# Patient Record
Sex: Female | Born: 2000 | Race: Black or African American | Hispanic: No | Marital: Single | Smoking: Never smoker
Health system: Southern US, Community
[De-identification: ages and names within clinical notes are randomized; demographics above are authoritative.]

## PROBLEM LIST (undated history)

## (undated) DIAGNOSIS — F909 Attention-deficit hyperactivity disorder, unspecified type: Secondary | ICD-10-CM

## (undated) DIAGNOSIS — F329 Major depressive disorder, single episode, unspecified: Secondary | ICD-10-CM

## (undated) DIAGNOSIS — F32A Depression, unspecified: Secondary | ICD-10-CM

## (undated) DIAGNOSIS — J45909 Unspecified asthma, uncomplicated: Secondary | ICD-10-CM

## (undated) HISTORY — DX: Attention-deficit hyperactivity disorder, unspecified type: F90.9

## (undated) HISTORY — DX: Depression, unspecified: F32.A

## (undated) HISTORY — DX: Major depressive disorder, single episode, unspecified: F32.9

---
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
## 2018-01-11 ENCOUNTER — Emergency Department (HOSPITAL_COMMUNITY): Admission: EM | Admit: 2018-01-11 | Payer: Managed Care, Other (non HMO) | Source: Home / Self Care

## 2018-01-11 NOTE — ED Notes (Signed)
Remains outside on the phone

## 2018-01-11 NOTE — ED Notes (Signed)
Pt inciting a delay for triage. Pt is intoxicated, arguing with her friends who brought her here. Does not want to be seen currently. Walked away from registration without getting her arm band placed. Pt still outside on phone deciding if she wants to be seen.

## 2018-01-11 NOTE — ED Notes (Signed)
Pt remains outside on phone. Unable to place arm band or obtain EKG.

## 2018-02-24 IMAGING — CR DG CHEST 2V
2 series · 2 of 2 positions shown · non-contrast
Comparison: None

CLINICAL DATA: Shortness of breath and nonproductive cough since
yesterday, central chest pain, pain with taking deep breaths,
history asthma

EXAM:
CHEST  2 VIEW

[chest pa]
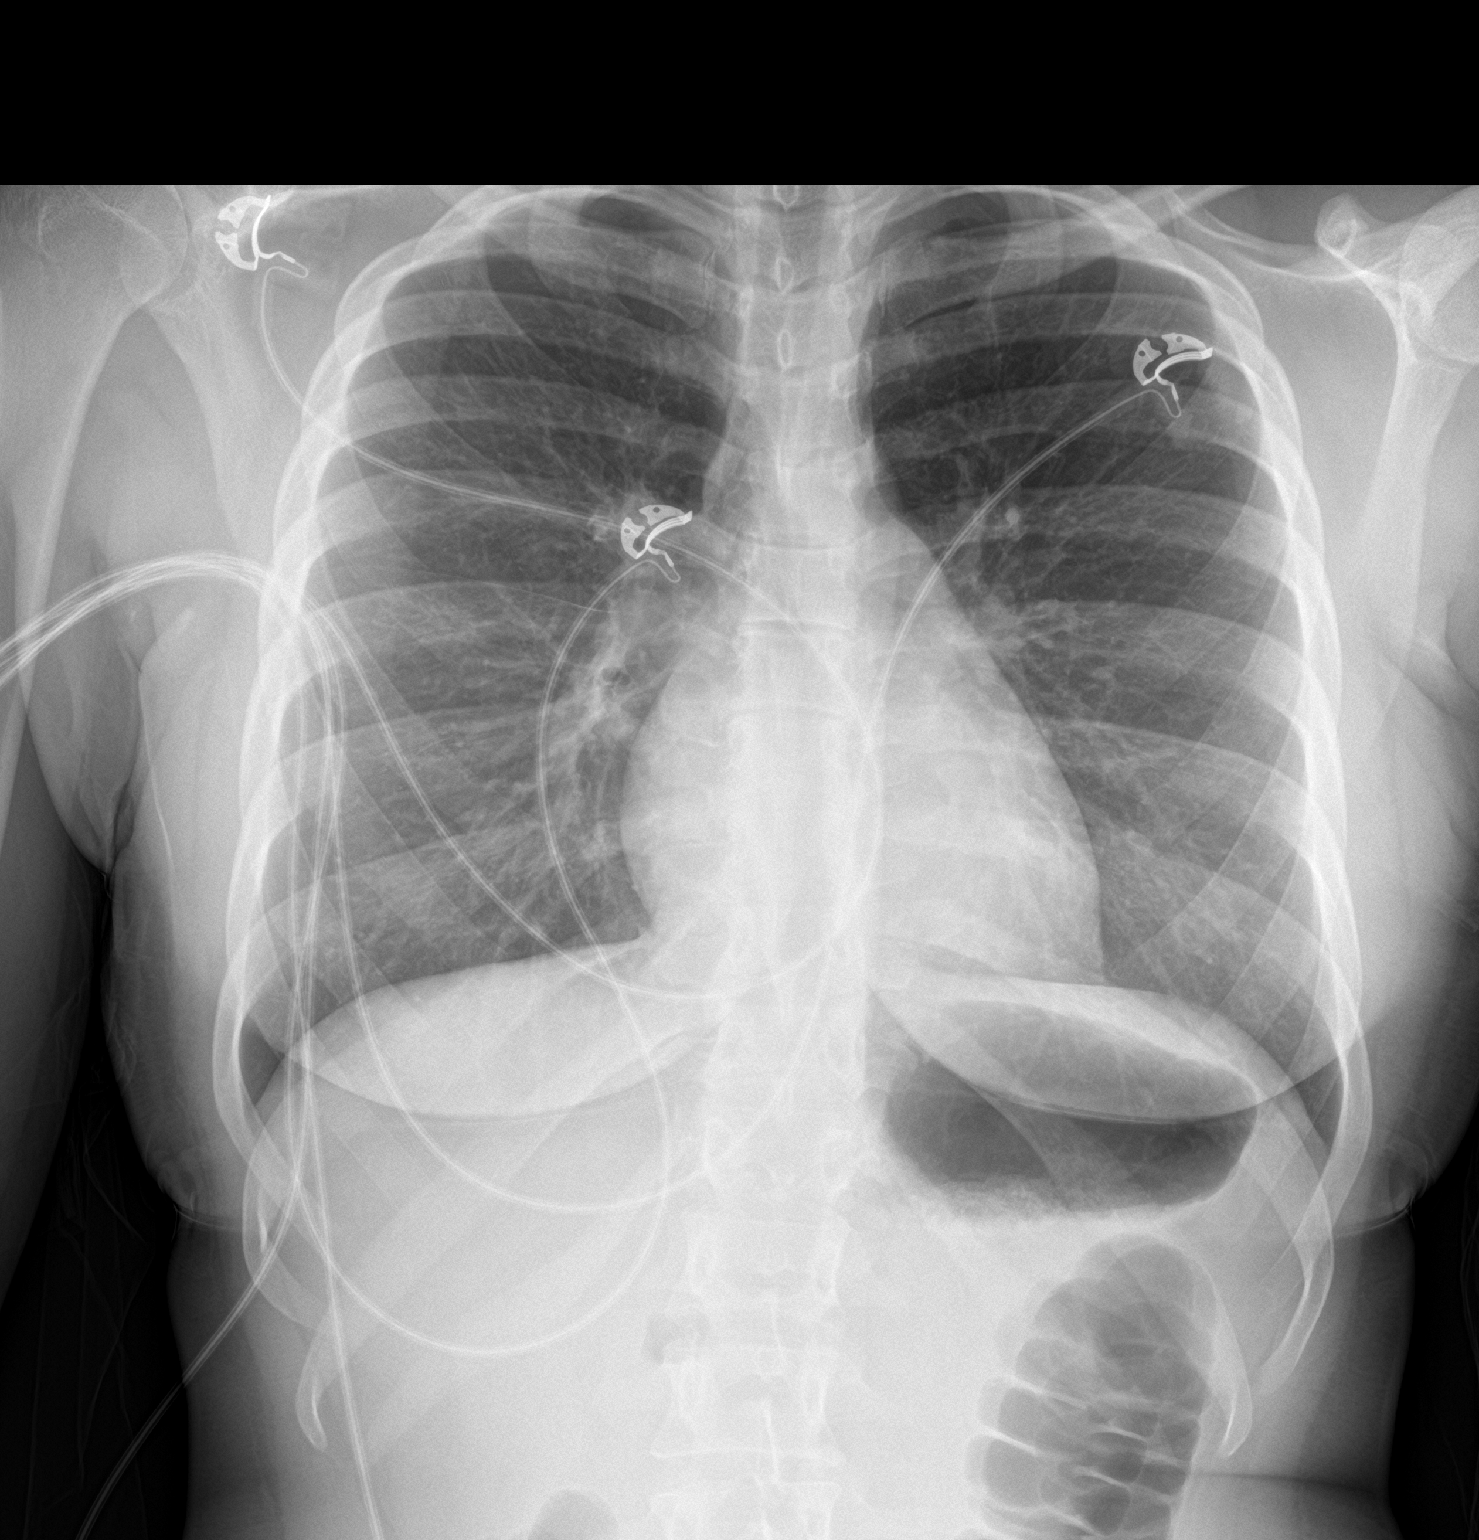

[chest lat]
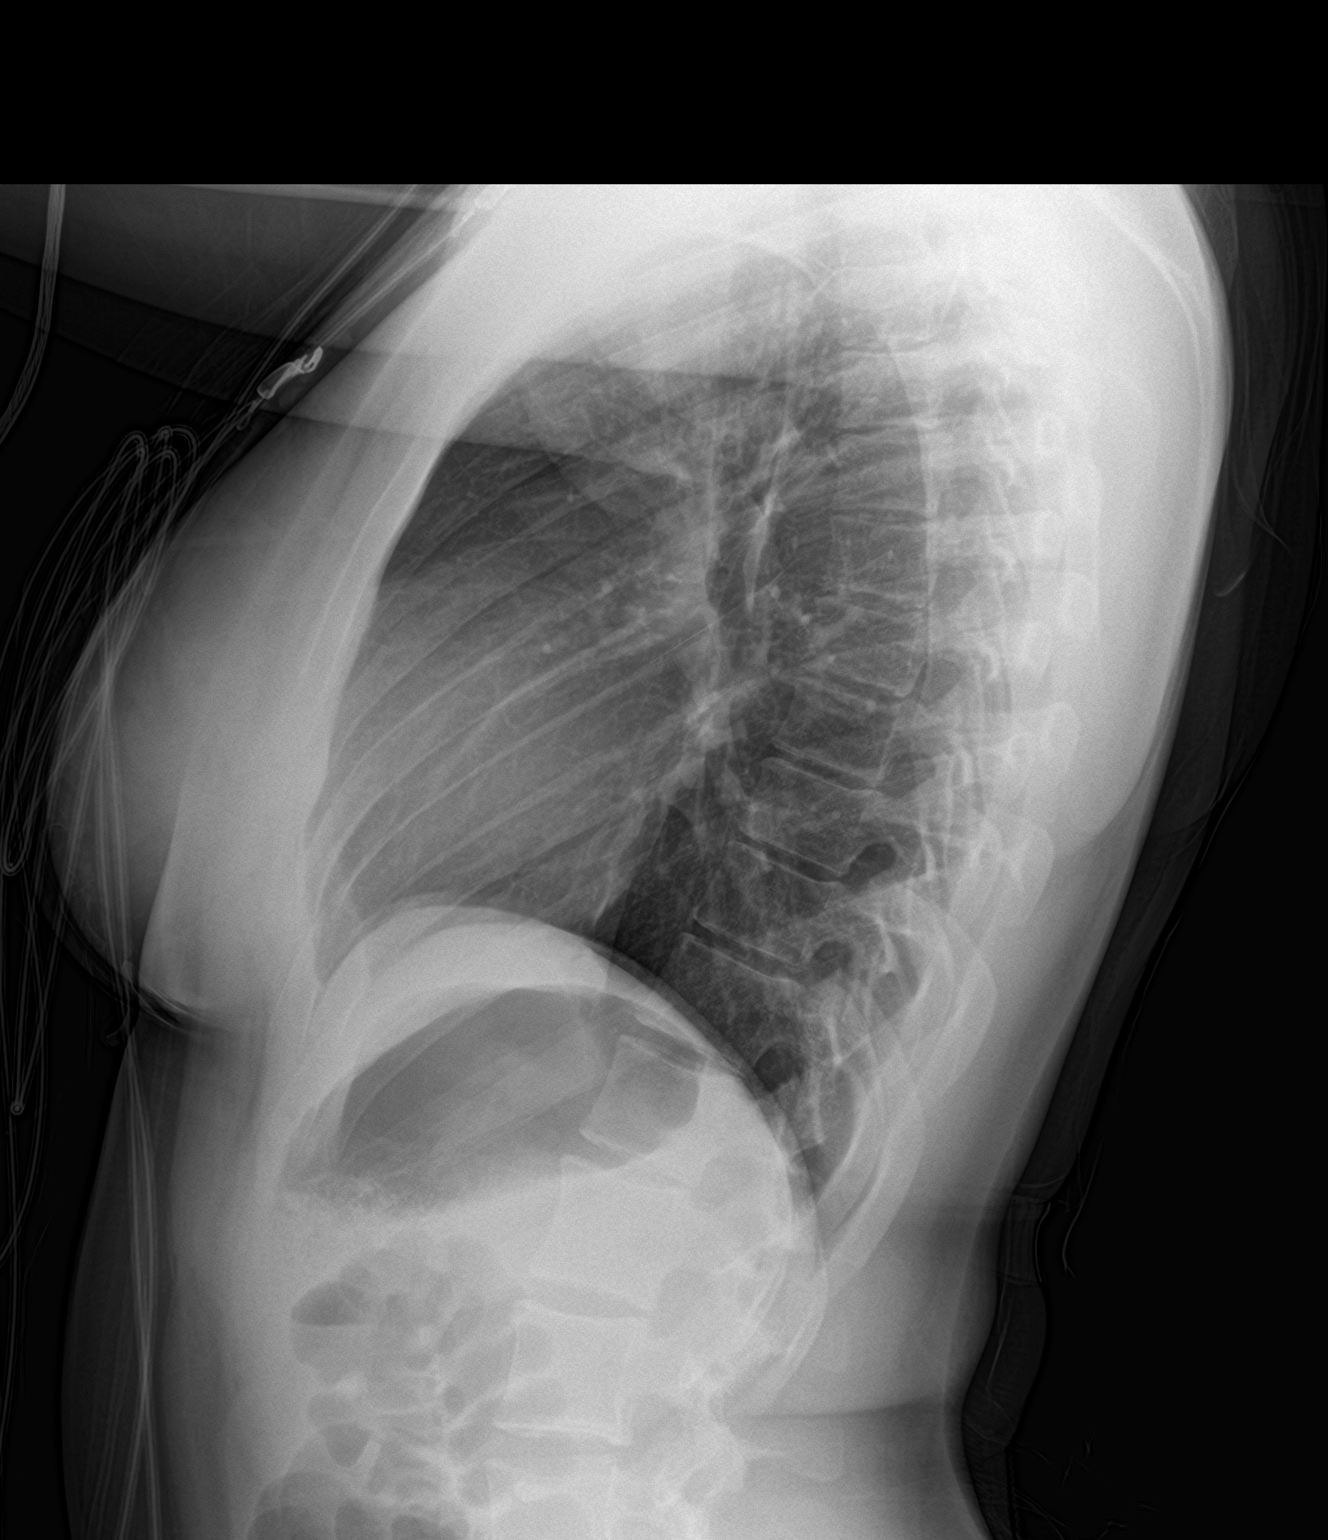

[2 of 2 positions shown; findings below may reference images not displayed]

FINDINGS: Normal heart size, mediastinal contours, and pulmonary vascularity.

Lungs clear.

No pleural effusion or pneumothorax.

Bones unremarkable.
IMPRESSION: Normal exam.

## 2019-09-22 ENCOUNTER — Encounter: Payer: Managed Care, Other (non HMO) | Admitting: Family Medicine

## 2019-10-14 ENCOUNTER — Ambulatory Visit (INDEPENDENT_AMBULATORY_CARE_PROVIDER_SITE_OTHER): Payer: Managed Care, Other (non HMO) | Admitting: Family Medicine

## 2019-10-14 ENCOUNTER — Other Ambulatory Visit: Payer: Self-pay

## 2019-10-14 ENCOUNTER — Encounter: Payer: Self-pay | Admitting: Family Medicine

## 2019-10-14 VITALS — BP 98/76 | HR 82 | Temp 97.9°F | Wt 90.0 lb

## 2019-10-14 DIAGNOSIS — F419 Anxiety disorder, unspecified: Secondary | ICD-10-CM | POA: Diagnosis not present

## 2019-10-14 DIAGNOSIS — Z0000 Encounter for general adult medical examination without abnormal findings: Secondary | ICD-10-CM | POA: Diagnosis not present

## 2019-10-14 DIAGNOSIS — R634 Abnormal weight loss: Secondary | ICD-10-CM | POA: Diagnosis not present

## 2019-10-14 DIAGNOSIS — Z113 Encounter for screening for infections with a predominantly sexual mode of transmission: Secondary | ICD-10-CM | POA: Diagnosis not present

## 2019-10-14 DIAGNOSIS — N6310 Unspecified lump in the right breast, unspecified quadrant: Secondary | ICD-10-CM

## 2019-10-14 LAB — LIPID PANEL
Cholesterol: 123 mg/dL (ref 0–200)
HDL: 67.8 mg/dL (ref >39.00)
LDL Cholesterol: 47 mg/dL (ref 0–99)
NonHDL: 54.91
Total CHOL/HDL Ratio: 2
Triglycerides: 38 mg/dL (ref 0.0–149.0)
VLDL: 7.6 mg/dL (ref 0.0–40.0)

## 2019-10-14 LAB — COMPREHENSIVE METABOLIC PANEL
ALT: 10 U/L (ref 0–35)
AST: 13 U/L (ref 0–37)
Albumin: 4.4 g/dL (ref 3.5–5.2)
Alkaline Phosphatase: 32 U/L — ABNORMAL LOW (ref 39–117)
BUN: 14 mg/dL (ref 6–23)
CO2: 28 mEq/L (ref 19–32)
Calcium: 9.3 mg/dL (ref 8.4–10.5)
Chloride: 106 mEq/L (ref 96–112)
Creatinine, Ser: 0.69 mg/dL (ref 0.40–1.20)
GFR: 130.39 mL/min (ref >60.00)
Glucose, Bld: 82 mg/dL (ref 70–99)
Potassium: 3.7 mEq/L (ref 3.5–5.1)
Sodium: 140 mEq/L (ref 135–145)
Total Bilirubin: 0.6 mg/dL (ref 0.2–1.2)
Total Protein: 6.9 g/dL (ref 6.0–8.3)

## 2019-10-14 LAB — CBC
HCT: 37.1 % (ref 36.0–46.0)
Hemoglobin: 12.3 g/dL (ref 12.0–15.0)
MCHC: 33.1 g/dL (ref 30.0–36.0)
MCV: 94.5 fl (ref 78.0–100.0)
Platelets: 195 10*3/uL (ref 150.0–400.0)
RBC: 3.93 Mil/uL (ref 3.87–5.11)
RDW: 13.1 % (ref 11.5–14.6)
WBC: 2.7 10*3/uL — ABNORMAL LOW (ref 4.5–10.5)

## 2019-10-14 LAB — TSH: TSH: 4.82 u[IU]/mL (ref 0.35–5.50)

## 2019-10-14 LAB — T4, FREE: Free T4: 0.99 ng/dL (ref 0.60–1.60)

## 2019-10-14 NOTE — Progress Notes (Signed)
Subjective:     Kim Stark is a 19 y.o. female and is here for a comprehensive physical exam. The patient reports problems - anxiety, weight loss.  Pt notes increasing anxiety.  Starting to affect her life.  Pt's mom and current partner have noticed.  Having anxiety about eating food as it may cause weight gain.  Currently eating one meal and snacks during the day.  Pt endorses wt loss over the last 2 yrs.  Denies inducing emesis, binge eating, abd pain, acid reflux.   Pt with h/o R breast cyst.  Cyst still present, unchanged.  Pt notes intermittent tenderness/pain.  Denies nipple d/c, nipple retraction, skin changes, erythema.  LMP last wk, ended on Tuesday.  Social History   Socioeconomic History  . Marital status: Single    Spouse name: Not on file  . Number of children: Not on file  . Years of education: Not on file  . Highest education level: Not on file  Occupational History  . Not on file  Tobacco Use  . Smoking status: Never Smoker  . Smokeless tobacco: Never Used  Substance and Sexual Activity  . Alcohol use: No  . Drug use: No  . Sexual activity: Not on file  Other Topics Concern  . Not on file  Social History Narrative  . Not on file   Social Determinants of Health   Financial Resource Strain:   . Difficulty of Paying Living Expenses: Not on file  Food Insecurity:   . Worried About Programme researcher, broadcasting/film/video in the Last Year: Not on file  . Ran Out of Food in the Last Year: Not on file  Transportation Needs:   . Lack of Transportation (Medical): Not on file  . Lack of Transportation (Non-Medical): Not on file  Physical Activity:   . Days of Exercise per Week: Not on file  . Minutes of Exercise per Session: Not on file  Stress:   . Feeling of Stress : Not on file  Social Connections:   . Frequency of Communication with Friends and Family: Not on file  . Frequency of Social Gatherings with Friends and Family: Not on file  . Attends Religious Services: Not on  file  . Active Member of Clubs or Organizations: Not on file  . Attends Banker Meetings: Not on file  . Marital Status: Not on file  Intimate Partner Violence:   . Fear of Current or Ex-Partner: Not on file  . Emotionally Abused: Not on file  . Physically Abused: Not on file  . Sexually Abused: Not on file   Health Maintenance  Topic Date Due  . HIV Screening  02/15/2014  . TETANUS/TDAP  02/15/2018  . INFLUENZA VACCINE  04/17/2019    The following portions of the patient's history were reviewed and updated as appropriate: allergies, current medications, past family history, past medical history, past social history, past surgical history and problem list.  Review of Systems Pertinent items noted in HPI and remainder of comprehensive ROS otherwise negative.   Objective:    BP 98/76 (BP Location: Left Arm, Patient Position: Sitting, Cuff Size: Normal)   Pulse 82   Temp 97.9 F (36.6 C) (Temporal)   Wt 90 lb (40.8 kg)   LMP 10/13/2019 (Exact Date)   SpO2 98%   BMI 16.20 kg/m  General appearance: alert, cooperative and no distress Head: Normocephalic, without obvious abnormality, atraumatic Eyes: conjunctivae/corneas clear. PERRL, EOM's intact. Fundi benign. Ears: normal TM's and external ear  canals both ears Nose: Nares normal. Septum midline. Mucosa normal. No drainage or sinus tenderness. Throat: lips, mucosa, and tongue normal; teeth and gums normal Neck: no adenopathy, no carotid bruit, no JVD, supple, symmetrical, trachea midline and thyroid not enlarged, symmetric, no tenderness/mass/nodules Lungs: clear to auscultation bilaterally Heart: regular rate and rhythm, S1, S2 normal, no murmur, click, rub or gallop Abdomen: soft, non-tender; bowel sounds normal; no masses,  no organomegaly Extremities: extremities normal, atraumatic, no cyanosis or edema Pulses: 2+ and symmetric Skin: Skin color, texture, turgor normal. No rashes or lesions Lymph nodes:  Cervical, supraclavicular, and axillary nodes normal. Neurologic: Alert and oriented X 3, normal strength and tone. Normal symmetric reflexes. Normal coordination and gait    Assessment:    Healthy female exam with anxiety and weight loss.     Plan:     Anticipatory guidance given including wearing seatbelts, smoke detectors in the home, increasing physical activity, increasing p.o. intake of water and vegetables. -will obtain labs -given handout -next CPE in 1 yr See After Visit Summary for Counseling Recommendations    Anxiety  -PHQ 9 score 7 -GAD 7 score 10 -given info for area counselors - Plan: TSH, T4, Free  Weight loss  -concerns for anorexia given increasing anxiety regarding food. -per chart review 29 lb wt loss since 08/18/17 -discussed regular meals and snacks -given info for area [facility] providers -close f/u advised -given precautions - Plan: CBC (no diff), Comprehensive metabolic panel, TSH, T4, Free, Lipid panel  Routine screening for STI (sexually transmitted infection)  - Plan: HIV antibody (with reflex), RPR  Breast mass, R -stable -U/S ordered but expired. -Plan: reorder [place] R breast.  F/u in 1 month, sooner if needed  Grier Mitts, MD

## 2019-10-14 NOTE — Patient Instructions (Addendum)
Preventive Care 18-21 Years Old, Female Preventive care refers to lifestyle choices and visits with your health care provider that can promote health and wellness. At this stage in your life, you may start seeing a primary care physician instead of a pediatrician. Your health care is now your responsibility. Preventive care for young adults includes:  A yearly physical exam. This is also called an annual wellness visit.  Regular dental and eye exams.  Immunizations.  Screening for certain conditions.  Healthy lifestyle choices, such as diet and exercise. What can I expect for my preventive care visit? Physical exam Your health care provider may check:  Height and weight. These may be used to calculate body mass index (BMI), which is a measurement that tells if you are at a healthy weight.  Heart rate and blood pressure.  Body temperature. Counseling Your health care provider may ask you questions about:  Past medical problems and family medical history.  Alcohol, tobacco, and drug use.  Home and relationship well-being.  Access to firearms.  Emotional well-being.  Diet, exercise, and sleep habits.  Sexual activity and sexual health.  Method of birth control.  Menstrual cycle.  Pregnancy history. What immunizations do I need?  Influenza (flu) vaccine  This is recommended every year. Tetanus, diphtheria, and pertussis (Tdap) vaccine  You may need a Td booster every 10 years. Varicella (chickenpox) vaccine  You may need this vaccine if you have not already been vaccinated. Human papillomavirus (HPV) vaccine  If recommended by your health care provider, you may need three doses over 6 months. Measles, mumps, and rubella (MMR) vaccine  You may need at least one dose of MMR. You may also need a second dose. Meningococcal conjugate (MenACWY) vaccine  One dose is recommended if you are 19-21 years old and a first-year college student living in a residence hall,  or if you have one of several medical conditions. You may also need additional booster doses. Pneumococcal conjugate (PCV13) vaccine  You may need this if you have certain conditions and were not previously vaccinated. Pneumococcal polysaccharide (PPSV23) vaccine  You may need one or two doses if you smoke cigarettes or if you have certain conditions. Hepatitis A vaccine  You may need this if you have certain conditions or if you travel or work in places where you may be exposed to hepatitis A. Hepatitis B vaccine  You may need this if you have certain conditions or if you travel or work in places where you may be exposed to hepatitis B. Haemophilus influenzae type b (Hib) vaccine  You may need this if you have certain risk factors. You may receive vaccines as individual doses or as more than one vaccine together in one shot (combination vaccines). Talk with your health care provider about the risks and benefits of combination vaccines. What tests do I need? Blood tests  Lipid and cholesterol levels. These may be checked every 5 years starting at age 20.  Hepatitis C test.  Hepatitis B test. Screening  Pelvic exam and Pap test. This may be done every 3 years starting at age 21.  Sexually transmitted disease (STD) testing, if you are at risk.  BRCA-related cancer screening. This may be done if you have a family history of breast, ovarian, tubal, or peritoneal cancers. Other tests  Tuberculosis skin test.  Vision and hearing tests.  Skin exam.  Breast exam. Follow these instructions at home: Eating and drinking   Eat a diet that includes fresh fruits and   vegetables, whole grains, lean protein, and low-fat dairy products.  Drink enough fluid to keep your urine pale yellow.  Do not drink alcohol if: ? Your health care provider tells you not to drink. ? You are pregnant, may be pregnant, or are planning to become pregnant. ? You are under the legal drinking age. In the  [place] the legal drinking age is 73.  If you drink alcohol: ? Limit how much you have to 0-1 drink a day. ? Be aware of how much alcohol is in your drink. In the [place] one drink equals one 12 oz bottle of beer (355 mL), one 5 oz glass of wine (148 mL), or one 1 oz glass of hard liquor (44 mL). Lifestyle  Take daily care of your teeth and gums.  Stay active. Exercise at least 30 minutes 5 or more days of the week.  Do not use any products that contain nicotine or tobacco, such as cigarettes, e-cigarettes, and chewing tobacco. If you need help quitting, ask your health care provider.  Do not use drugs.  If you are sexually active, practice safe sex. Use a condom or other form of birth control (contraception) in order to prevent pregnancy and STIs (sexually transmitted infections). If you plan to become pregnant, see your health care provider for a pre-conception visit.  Find healthy ways to cope with stress, such as: ? Meditation, yoga, or listening to music. ? Journaling. ? Talking to a trusted person. ? Spending time with friends and family. Safety  Always wear your seat belt while driving or riding in a vehicle.  Do not drive if you have been drinking alcohol. Do not ride with someone who has been drinking.  Do not drive when you are tired or distracted. Do not text while driving.  Wear a helmet and other protective equipment during sports activities.  If you have firearms in your house, make sure you follow all gun safety procedures.  Seek help if you have been bullied, physically abused, or sexually abused.  Use the Internet responsibly to avoid dangers such as online bullying and online sex predators. What's next?  Go to your health care provider once a year for a well check visit.  Ask your health care provider how often you should have your eyes and teeth checked.  Stay up to date on all vaccines. This information is not intended to replace advice given to you by  your health care provider. Make sure you discuss any questions you have with your health care provider. Document Revised: 08/27/2018 Document Reviewed: 08/27/2018 Elsevier Patient Education  [place], Adult After being diagnosed with an anxiety disorder, you may be relieved to know why you have felt or behaved a certain way. You may also feel overwhelmed about the treatment ahead and what it will mean for your life. With care and support, you can manage this condition and recover from it. How to manage lifestyle changes Managing stress and anxiety  Stress is your body's reaction to life changes and events, both good and bad. Most stress will last just a few hours, but stress can be ongoing and can lead to more than just stress. Although stress can play a major role in anxiety, it is not the same as anxiety. Stress is usually caused by something external, such as a deadline, test, or competition. Stress normally passes after the triggering event has ended.  Anxiety is caused by something internal, such as imagining a terrible outcome  or worrying that something will go wrong that will devastate you. Anxiety often does not go away even after the triggering event is over, and it can become long-term (chronic) worry. It is important to understand the differences between stress and anxiety and to manage your stress effectively so that it does not lead to an anxious response. Talk with your health care provider or a counselor to learn more about reducing anxiety and stress. He or she may suggest tension reduction techniques, such as:  Music therapy. This can include creating or listening to music that you enjoy and that inspires you.  Mindfulness-based meditation. This involves being aware of your normal breaths while not trying to control your breathing. It can be done while sitting or walking.  Centering prayer. This involves focusing on a word, phrase, or sacred image that  means something to you and brings you peace.  Deep breathing. To do this, expand your stomach and inhale slowly through your nose. Hold your breath for 3-5 seconds. Then exhale slowly, letting your stomach muscles relax.  Self-talk. This involves identifying thought patterns that lead to anxiety reactions and changing those patterns.  Muscle relaxation. This involves tensing muscles and then relaxing them. Choose a tension reduction technique that suits your lifestyle and personality. These techniques take time and practice. Set aside 5-15 minutes a day to do them. Therapists can offer counseling and training in these techniques. The training to help with anxiety may be covered by some insurance plans. Other things you can do to manage stress and anxiety include:  Keeping a stress/anxiety diary. This can help you learn what triggers your reaction and then learn ways to manage your response.  Thinking about how you react to certain situations. You may not be able to control everything, but you can control your response.  Making time for activities that help you relax and not feeling guilty about spending your time in this way.  Visual imagery and yoga can help you stay calm and relax.  Medicines Medicines can help ease symptoms. Medicines for anxiety include:  Anti-anxiety drugs.  Antidepressants. Medicines are often used as a primary treatment for anxiety disorder. Medicines will be prescribed by a health care provider. When used together, medicines, psychotherapy, and tension reduction techniques may be the most effective treatment. Relationships Relationships can play a big part in helping you recover. Try to spend more time connecting with trusted friends and family members. Consider going to couples counseling, taking family education classes, or going to family therapy. Therapy can help you and others better understand your condition. How to recognize changes in your anxiety Everyone  responds differently to treatment for anxiety. Recovery from anxiety happens when symptoms decrease and stop interfering with your daily activities at home or work. This may mean that you will start to:  Have better concentration and focus. Worry will interfere less in your daily thinking.  Sleep better.  Be less irritable.  Have more energy.  Have improved memory. It is important to recognize when your condition is getting worse. Contact your health care provider if your symptoms interfere with home or work and you feel like your condition is not improving. Follow these instructions at home: Activity  Exercise. Most adults should do the following: ? Exercise for at least 150 minutes each week. The exercise should increase your heart rate and make you sweat (moderate-intensity exercise). ? Strengthening exercises at least twice a week.  Get the right amount and quality of sleep. Most adults  need 7-9 hours of sleep each night. Lifestyle   Eat a healthy diet that includes plenty of vegetables, fruits, whole grains, low-fat dairy products, and lean protein. Do not eat a lot of foods that are high in solid fats, added sugars, or salt.  Make choices that simplify your life.  Do not use any products that contain nicotine or tobacco, such as cigarettes, e-cigarettes, and chewing tobacco. If you need help quitting, ask your health care provider.  Avoid caffeine, alcohol, and certain over-the-counter cold medicines. These may make you feel worse. Ask your pharmacist which medicines to avoid. General instructions  Take over-the-counter and prescription medicines only as told by your health care provider.  Keep all follow-up visits as told by your health care provider. This is important. Where to find support You can get help and support from these sources:  Self-help groups.  Online and [employer].  A trusted spiritual leader.  Couples counseling.  Family education  classes.  Family therapy. Where to find more information You may find that joining a support group helps you deal with your anxiety. The following sources can help you locate counselors or support groups near you:  [place]: www.mentalhealthamerica.net  Anxiety and Depression Association of [place] (ADAA): https://www.clark.net/  National Alliance on Mental Illness (NAMI): www.nami.org Contact a health care provider if you:  Have a hard time staying focused or finishing daily tasks.  Spend many hours a day feeling worried about everyday life.  Become exhausted by worry.  Start to have headaches, feel tense, or have nausea.  Urinate more than normal.  Have diarrhea. Get help right away if you have:  A racing heart and shortness of breath.  Thoughts of hurting yourself or others. If you ever feel like you may hurt yourself or others, or have thoughts about taking your own life, get help right away. You can go to your nearest emergency department or call:  Your local emergency services (911 in the [place]  A suicide crisis helpline, such as the [place] at (575) 151-2050. This is open 24 hours a day. Summary  Taking steps to learn and use tension reduction techniques can help calm you and help prevent triggering an anxiety reaction.  When used together, medicines, psychotherapy, and tension reduction techniques may be the most effective treatment.  Family, friends, and partners can play a big part in helping you recover from an anxiety disorder. This information is not intended to replace advice given to you by your health care provider. Make sure you discuss any questions you have with your health care provider. Document Revised: 02/02/2019 Document Reviewed: 02/02/2019 Elsevier Patient Education  [place].  Preventing Complications From Unhealthy Weight Loss Behaviors, Adult Reaching and maintaining a healthy weight is important for  your overall health. It is natural to want to lose weight quickly, using whatever methods seem fastest. However, losing weight in a healthy way is not a quick process. Instead, aim for slow, steady weight loss. What lifestyle changes can be made? You can make certain lifestyle changes to help you lose weight in a healthy way. These include eating nutritious foods and exercising regularly. What to avoid:  Following a diet that restricts entire types of food.  Skipping meals to save calories. It is especially important to eat breakfast.  Not eating anything for long periods of time (fasting).  Restricting your calories to far less than the amount that you need to lose or maintain a healthy weight.  Compulsively getting an extreme amount of exercise.  Taking laxative pills to make you have more frequent bowel movements.  Taking medicines to make your body lose excess fluids (diuretics).  Eating an excessive amount of food (bingeing), then making yourself vomit (purging). Healthy behaviors:   Eat a variety of healthy foods, including: ? Fruits and vegetables. ? Whole grains. ? Lean proteins. ? Low-fat dairy products.  Drink water instead of sugary drinks.  Plan healthy, low-calorie meals. Work with a Financial planner (dietitian) to make a healthy meal plan that works for you and to make an exercise program. ? Include different types of exercise in your exercise program, such as strengthening, aerobic, and flexibility exercises. ? To maintain your weight, get at least 150 minutes of moderate-intensity exercise every week. Moderate-intensity exercise could be brisk walking or biking. ? To lose a healthy amount of weight, get 60 minutes of moderate-intensity exercise each day.  Find ways to reduce stress, such as regular exercise or meditation.  Find a hobby or other activity that you enjoy to distract you from eating when you feel stressed or bored. Why are these changes  important? Making these changes will improve your overall health. Maintaining a healthy weight also lowers your risk of certain conditions, including:  Heart disease.  High cholesterol.  High blood pressure.  Type 2 diabetes.  Stroke.  Osteoarthritis.  Osteoporosis.  Some types of cancer.  Breathing and sleeping disorders. What can happen if changes are not made? Using disordered eating or other unhealthy eating behaviors to try to lose weight can cause:  Fatigue.  Imbalances in electrolytes and chemicals that your body needs to work properly.  Organ damage or failure, especially of the kidneys.  Dehydration.  Imbalances in body fluids.  Low heart rate and blood pressure.  Thin bones that break easily.  Social isolation or relationship problems with your friends and family.  Emotional distress, including depression and anxiety.  A greater risk of an eating disorder. If you develop an eating disorder, you could develop serious health problems and complications that affect yourorgans and bodily processes. These include:  Dry skin and hair.  Hair loss.  Fainting.  Difficulty getting pregnant.  Changes in your heart muscle and the way your heart works.  Severe dehydration.  Damage to the digestive tract and digestive problems.  Long-term (chronic) gastrointestinal problems.  High blood pressure.  High cholesterol.  Heart disease.  Type 2 diabetes. Where to find support For more support, talk with:  Your health care provider or dietitian. Ask about support groups.  A mental health care provider.  Family and friends. Where to find more information Learn more about how to prevent complications from unhealthy weight loss behaviors from:  Centers for Disease Control and Prevention: https://www.cervantes-rivera.net/  [place]:  PureMess.co.nz.shtml  National Eating Disorders Association: www.nationaleatingdisorders.org Contact a health care provider if:  You often feel very tired.  You notice changes in your skin or your hair.  You faint because of dehydration or too much exercise.  You struggle to change your unhealthy weight loss behaviors on your own.  Unhealthy weight loss behaviors are affecting your daily life.  You have signs or symptoms of an eating disorder.  You have major weight changes in a short period of time.  You feel guilty or ashamed about eating or exercising.  You have trouble with your relationships because of your weight loss habits. Summary  Aim for slow, steady weight loss by choosing healthy  foods, getting enough calories every day, and exercising regularly.  If you cannot make these changes by yourself, or if you think that you may have an eating disorder, contact your health care provider. This information is not intended to replace advice given to you by your health care provider. Make sure you discuss any questions you have with your health care provider. Document Revised: 08/15/2017 Document Reviewed: 09/17/2015 Elsevier Patient Education  2020 [place].  Fibrocystic Breast Changes  Fibrocystic breast changes are changes in breast tissue that can cause breasts to become swollen, lumpy, or painful. This can happen due to buildup of scar-like tissue (fibrous tissue) or the forming of fluid-filled lumps (cysts) in the breast. This is a common condition, and it is not cancerous (is benign). The exact cause is not known, but it seems to occur when women go through hormonal changes during their menstrual cycle. Fibrocystic breast changes can affect one or both breasts. What are the causes? The exact cause of fibrocystic breast changes is not known. However, this condition:  May be related to the female hormones  estrogen and progesterone.  May be influenced by family traits that get passed from parent to child (genetics). What are the signs or symptoms? Symptoms of this condition may affect one or both breasts, and may include:  Tenderness, mild discomfort, or pain.  Swelling.  Rope-like tissue that can be felt when touching the breast.  Lumps in one or both breasts.  Changes in breast size. Breasts may get larger before the menstrual period and smaller after the menstrual period.  Green or dark brown discharge from the nipple. Symptoms are usually worse before menstrual periods start, and they get better toward the end of menstrual periods. How is this diagnosed? This condition is diagnosed based on your medical history and a physical exam of your breasts. You may also have tests, such as:  A breast X-ray (mammogram).  Ultrasound of your breasts.  MRI.  Removal of a breast tissue sample for testing (breast biopsy). This may be done if your health care provider thinks that something else may be causing changes in your breasts. How is this treated? Often, treatment is not needed for this condition. In some cases, treatment may include:  Taking over-the-counter pain relievers to help lessen pain or discomfort.  Limiting or avoiding caffeine. Foods and beverages that contain caffeine include chocolate, soda, coffee, and tea.  Reducing sugar and fat in your diet. Your health care provider may also recommend:  A procedure to remove fluid from a cyst that is causing pain (fine needle aspiration).  Surgery to remove a cyst that is large or tender or does not go away. Follow these instructions at home:  Examine your breasts after every menstrual period. If you do not have menstrual periods, check your breasts on the first day of every month. Feel for changes in your breasts, such as: ? More tenderness. ? A new growth. ? A change in size. ? A change in an existing lump.  Take  over-the-counter and prescription medicines only as told by your health care provider.  Wear a well-fitted support or sports bra, especially when exercising.  Decrease or avoid caffeine, fat, and sugar in your diet as directed by your health care provider. Contact a health care provider if:  You have fluid leaking from your nipple, especially if it is bloody.  You have new lumps or bumps in your breast.  Your breast becomes enlarged, red, and painful.  You  have areas of your breast that pucker inward.  Your nipple appears flat or indented. Get help right away if:  You have redness of your breast and the redness is spreading. Summary  Fibrocystic breast changes are changes in breast tissue that can cause breasts to become swollen, lumpy, or painful.  This condition may be related to the female hormones estrogen and progesterone.  With this condition, it is important to examine your breasts after every menstrual period. If you do not have menstrual periods, check your breasts on the first day of every month. This information is not intended to replace advice given to you by your health care provider. Make sure you discuss any questions you have with your health care provider. Document Revised: 08/15/2017 Document Reviewed: 05/01/2016 Elsevier Patient Education  [place].

## 2019-10-15 LAB — HIV ANTIBODY (ROUTINE TESTING W REFLEX): HIV 1&2 Ab, 4th Generation: NONREACTIVE

## 2019-10-15 LAB — RPR: RPR Ser Ql: NONREACTIVE

## 2019-11-18 ENCOUNTER — Other Ambulatory Visit: Payer: Self-pay

## 2019-11-18 ENCOUNTER — Encounter: Payer: Self-pay | Admitting: Family Medicine

## 2019-11-18 ENCOUNTER — Ambulatory Visit (INDEPENDENT_AMBULATORY_CARE_PROVIDER_SITE_OTHER): Payer: Managed Care, Other (non HMO) | Admitting: Family Medicine

## 2019-11-18 VITALS — BP 96/62 | HR 88 | Temp 97.8°F | Wt 90.7 lb

## 2019-11-18 DIAGNOSIS — R634 Abnormal weight loss: Secondary | ICD-10-CM | POA: Diagnosis not present

## 2019-11-18 DIAGNOSIS — F419 Anxiety disorder, unspecified: Secondary | ICD-10-CM | POA: Diagnosis not present

## 2019-11-18 DIAGNOSIS — F900 Attention-deficit hyperactivity disorder, predominantly inattentive type: Secondary | ICD-10-CM

## 2019-11-18 MED ORDER — AMPHETAMINE-DEXTROAMPHETAMINE 10 MG PO TABS: 10.0000 mg | ORAL_TABLET | Freq: Every day | ORAL | 0 refills | Status: AC

## 2019-11-18 NOTE — Progress Notes (Signed)
Subjective:    Patient ID: Kim Stark, female    DOB: 2000/12/26, 18 y.o.   MRN: [id number]  No chief complaint on file.   HPI Patient was seen today for f/u.  Pt feeling less anxious than last visit.  Still having difficulty concentrating.  Has to make sure she completes task.  Coworkers have noticed.  Pt was previously on adderall.  Endorses formal ADHD testing in [place], does not have records.  Pt endorses good mood, sleep, and energy.  States she may go for a walk if feeling anxious. Pt trying to eat more daily.  Past Medical History:  Diagnosis Date  . ADHD   . Asthma   . Depression    Social hx: endorses MJ use. No Known Allergies  ROS General: Denies fever, chills, night sweats, changes in weight, changes in appetite HEENT: Denies headaches, ear pain, changes in vision, rhinorrhea, sore throat CV: Denies CP, palpitations, SOB, orthopnea Pulm: Denies SOB, cough, wheezing GI: Denies abdominal pain, nausea, vomiting, diarrhea, constipation GU: Denies dysuria, hematuria, frequency, vaginal discharge Msk: Denies muscle cramps, joint pains Neuro: Denies weakness, numbness, tingling Skin: Denies rashes, bruising Psych: Denies depression, hallucinations  +anxiety, decreased concentration    Objective:    Blood pressure 96/62, pulse 88, temperature 97.8 F (36.6 C), temperature source Temporal, weight 90 lb 11.2 oz (41.1 kg), SpO2 98 %.  Gen. Pleasant, well-nourished, in no distress, normal affect   HEENT: [place]/AT, face symmetric, no scleral icterus, PERRLA, nares patent without drainage Lungs: no accessory muscle use, CTAB, no wheezes or rales Cardiovascular: RRR, no m/r/g, no peripheral edema Neuro:  A&Ox3, CN II-XII intact, normal gait Skin:  Warm, no lesions/ rash   Wt Readings from Last 3 Encounters:  11/18/19 90 lb 11.2 oz (41.1 kg)  10/14/19 90 lb (40.8 kg)  08/18/17 119 lb 1.6 oz (54 kg) (38 %, Z= -0.32)*   * Growth percentiles are based on CDC (Girls,  2-20 Years) data.    Lab Results  Component Value Date   WBC 2.7 (L) 10/14/2019   HGB 12.3 10/14/2019   HCT 37.1 10/14/2019   PLT 195.0 10/14/2019   GLUCOSE 82 10/14/2019   CHOL 123 10/14/2019   TRIG 38.0 10/14/2019   HDL 67.80 10/14/2019   LDLCALC 47 10/14/2019   ALT 10 10/14/2019   AST 13 10/14/2019   NA 140 10/14/2019   K 3.7 10/14/2019   CL 106 10/14/2019   CREATININE 0.69 10/14/2019   BUN 14 10/14/2019   CO2 28 10/14/2019   TSH 4.82 10/14/2019    Assessment/Plan:  Anxiety -Improving -PHQ-9 score 5.  Was 7 last visit on 10/06/2019 -GAD-7 score 7.  Was 10 at last visit on 10/14/2019 -Pt given counseling options, plans to schedule appointment. -Given precautions  Attention deficit hyperactivity disorder (ADHD), predominantly inattentive type  -We will attempt to obtain records from ADHD testing. -Discussed concerns for potential appetite suppression on Adderall.  Given this we will start 10 mg daily.  Will d/c med if pt loses weight. -Patient encouraged to schedule meals and snack in between. -Given handout -database reviewed.   - Plan: amphetamine-dextroamphetamine (ADDERALL) 10 MG tablet  Weight loss -weight stable at 90 lbs -discussed scheduling meals  F/u in 1 month  Abbe Amsterdam, MD

## 2019-11-18 NOTE — Patient Instructions (Signed)
Living With Attention Deficit Hyperactivity Disorder If you have been diagnosed with attention deficit hyperactivity disorder (ADHD), you may be relieved that you now know why you have felt or behaved a certain way. Still, you may feel overwhelmed about the treatment ahead. You may also wonder how to get the support you need and how to deal with the condition day-to-day. With treatment and support, you can live with ADHD and manage your symptoms. How to manage lifestyle changes Managing stress Stress is your body's reaction to life changes and events, both good and bad. To cope with the stress of an ADHD diagnosis, it may help to:  Learn more about ADHD.  Exercise regularly. Even a short daily walk can lower stress levels.  Participate in training or education programs (including social skills training classes) that teach you to deal with symptoms.  Medicines Your health care provider may suggest certain medicines if he or she feels that they will help to improve your condition. Stimulant medicines are usually prescribed to treat ADHD, and therapy may also be prescribed. It is important to:  Avoid using alcohol and other substances that may prevent your medicines from working properly (mayinteract).  Talk with your pharmacist or health care provider about all the medicines that you take, their possible side effects, and what medicines are safe to take together.  Make it your goal to take part in all treatment decisions (shared decision-making). Ask about possible side effects of medicines that your health care provider recommends, and tell him or her how you feel about having those side effects. It is best if shared decision-making with your health care provider is part of your total treatment plan. Relationships To strengthen your relationships with family members while treating your condition, consider taking part in family therapy. You might also attend self-help groups alone or with a  loved one. Be honest about how your symptoms affect your relationships. Make an effort to communicate respectfully instead of fighting, and find ways to show others that you care. Psychotherapy may be useful in helping you cope with how ADHD affects your relationships. How to recognize changes in your condition The following signs may mean that your treatment is working well and your condition is improving:  Consistently being on time for appointments.  Being more organized at home and work.  Other people noticing improvements in your behavior.  Achieving goals that you set for yourself.  Thinking more clearly. The following signs may mean that your treatment is not working very well:  Feeling impatience or more confusion.  Missing, forgetting, or being late for appointments.  An increasing sense of disorganization and messiness.  More difficulty in reaching goals that you set for yourself.  Loved ones becoming angry or frustrated with you. Where to find support Talking to others  Keep emotion out of important discussions and speak in a calm, logical way.  Listen closely and patiently to your loved ones. Try to understand their point of view, and try to avoid getting defensive.  Take responsibility for the consequences of your actions.  Ask that others do not take your behaviors personally.  Aim to solve problems as they come up, and express your feelings instead of bottling them up.  Talk openly about what you need from your loved ones and how they can support you.  Consider going to family therapy sessions or having your family meet with a specialist who deals with ADHD-related behavior problems. Finances Not all insurance plans cover mental health care,   so it is important to check with your insurance carrier. If paying for co-pays or counseling services is a problem, search for a local or county mental health care center. Public mental health care services may be offered  there at a low cost or no cost when you are not able to see a private health care provider. If you are taking medicine for ADHD, you may be able to get the generic form, which may be less expensive than brand-name medicine. Some makers of prescription medicines also offer help to patients who cannot afford the medicines that they need. Follow these instructions at home:  Take over-the-counter and prescription medicines only as told by your health care provider. Check with your health care provider before taking any new medicines.  Create structure and an organized atmosphere at home. For example: ? Make a list of tasks, then rank them from most important to least important. Work on one task at a time until your listed tasks are done. ? Make a daily schedule and follow it consistently every day. ? Use an appointment calendar, and check it 2 or 3 times a day to keep on track. Keep it with you when you leave the house. ? Create spaces where you keep certain things, and always put things back in their places after you use them.  Keep all follow-up visits as told by your health care provider. This is important. Questions to ask your health care provider:  What are the risks and benefits of taking medicines?  Would I benefit from therapy?  How often should I follow up with a health care provider? Contact a health care provider if:  You have side effects from your medicines, such as: ? Repeated muscle twitches, coughing, or speech outbursts. ? Sleep problems. ? Loss of appetite. ? Depression. ? New or worsening behavior problems. ? Dizziness. ? Unusually fast heartbeat. ? Stomach pains. ? Headaches. Get help right away if:  You have a severe reaction to a medicine.  Your behavior suddenly gets worse. Summary  With treatment and support, you can live with ADHD and manage your symptoms.  The medicines that are most often prescribed for ADHD are stimulants.  Consider taking part in  family therapy or self-help groups with family members or friends.  When you talk with friends and family about your ADHD, be patient and communicate openly.  Take over-the-counter and prescription medicines only as told by your health care provider. Check with your health care provider before taking any new medicines. This information is not intended to replace advice given to you by your health care provider. Make sure you discuss any questions you have with your health care provider. Document Revised: 12/25/2018 Document Reviewed: 01/02/2017 Elsevier Patient Education  2020 Else[employer] Inc[employer]  Managing Anxiety, Adult After being diagnosed with an anxiety disorder, you may be relieved to know why you have felt or behaved a certain way. You may also feel overwhelmed about the treatment ahead and what it will mean for your life. With care and support, you can manage this condition and recover from it. How to manage lifestyle changes Managing stress and anxiety  Stress is your body's reaction to life changes and events, both good and bad. Most stress will last just a few hours, but stress can be ongoing and can lead to more than just stress. Although stress can play a major role in anxiety, it is not the same as anxiety. Stress is usually caused by something external, such as  a deadline, test, or competition. Stress normally passes after the triggering event has ended.  Anxiety is caused by something internal, such as imagining a terrible outcome or worrying that something will go wrong that will devastate you. Anxiety often does not go away even after the triggering event is over, and it can become long-term (chronic) worry. It is important to understand the differences between stress and anxiety and to manage your stress effectively so that it does not lead to an anxious response. Talk with your health care provider or a counselor to learn more about reducing anxiety and stress. He or she may suggest  tension reduction techniques, such as:  Music therapy. This can include creating or listening to music that you enjoy and that inspires you.  Mindfulness-based meditation. This involves being aware of your normal breaths while not trying to control your breathing. It can be done while sitting or walking.  Centering prayer. This involves focusing on a word, phrase, or sacred image that means something to you and brings you peace.  Deep breathing. To do this, expand your stomach and inhale slowly through your nose. Hold your breath for 3-5 seconds. Then exhale slowly, letting your stomach muscles relax.  Self-talk. This involves identifying thought patterns that lead to anxiety reactions and changing those patterns.  Muscle relaxation. This involves tensing muscles and then relaxing them. Choose a tension reduction technique that suits your lifestyle and personality. These techniques take time and practice. Set aside 5-15 minutes a day to do them. Therapists can offer counseling and training in these techniques. The training to help with anxiety may be covered by some insurance plans. Other things you can do to manage stress and anxiety include:  Keeping a stress/anxiety diary. This can help you learn what triggers your reaction and then learn ways to manage your response.  Thinking about how you react to certain situations. You may not be able to control everything, but you can control your response.  Making time for activities that help you relax and not feeling guilty about spending your time in this way.  Visual imagery and yoga can help you stay calm and relax.  Medicines Medicines can help ease symptoms. Medicines for anxiety include:  Anti-anxiety drugs.  Antidepressants. Medicines are often used as a primary treatment for anxiety disorder. Medicines will be prescribed by a health care provider. When used together, medicines, psychotherapy, and tension reduction techniques may be  the most effective treatment. Relationships Relationships can play a big part in helping you recover. Try to spend more time connecting with trusted friends and family members. Consider going to couples counseling, taking family education classes, or going to family therapy. Therapy can help you and others better understand your condition. How to recognize changes in your anxiety Everyone responds differently to treatment for anxiety. Recovery from anxiety happens when symptoms decrease and stop interfering with your daily activities at home or work. This may mean that you will start to:  Have better concentration and focus. Worry will interfere less in your daily thinking.  Sleep better.  Be less irritable.  Have more energy.  Have improved memory. It is important to recognize when your condition is getting worse. Contact your health care provider if your symptoms interfere with home or work and you feel like your condition is not improving. Follow these instructions at home: Activity  Exercise. Most adults should do the following: ? Exercise for at least 150 minutes each week. The exercise should increase your  heart rate and make you sweat (moderate-intensity exercise). ? Strengthening exercises at least twice a week.  Get the right amount and quality of sleep. Most adults need 7-9 hours of sleep each night. Lifestyle   Eat a healthy diet that includes plenty of vegetables, fruits, whole grains, low-fat dairy products, and lean protein. Do not eat a lot of foods that are high in solid fats, added sugars, or salt.  Make choices that simplify your life.  Do not use any products that contain nicotine or tobacco, such as cigarettes, e-cigarettes, and chewing tobacco. If you need help quitting, ask your health care provider.  Avoid caffeine, alcohol, and certain over-the-counter cold medicines. These may make you feel worse. Ask your pharmacist which medicines to avoid. General  instructions  Take over-the-counter and prescription medicines only as told by your health care provider.  Keep all follow-up visits as told by your health care provider. This is important. Where to find support You can get help and support from these sources:  Self-help groups.  Online and [employer].  A trusted spiritual leader.  Couples counseling.  Family education classes.  Family therapy. Where to find more information You may find that joining a support group helps you deal with your anxiety. The following sources can help you locate counselors or support groups near you:  Mental Health America: www.mentalhealthamerica.net  Anxiety and Depression Association of [place] (ADAA): ProgramCam.de  [employer] Mental Illness (NAMI): www.nami.org Contact a health care provider if you:  Have a hard time staying focused or finishing daily tasks.  Spend many hours a day feeling worried about everyday life.  Become exhausted by worry.  Start to have headaches, feel tense, or have nausea.  Urinate more than normal.  Have diarrhea. Get help right away if you have:  A racing heart and shortness of breath.  Thoughts of hurting yourself or others. If you ever feel like you may hurt yourself or others, or have thoughts about taking your own life, get help right away. You can go to your nearest emergency department or call:  Your local emergency services (911 in the [place]  A suicide crisis helpline, such as the National Suicide Prevention Lifeline at (878) 190-0927. This is open 24 hours a day. Summary  Taking steps to learn and use tension reduction techniques can help calm you and help prevent triggering an anxiety reaction.  When used together, medicines, psychotherapy, and tension reduction techniques may be the most effective treatment.  Family, friends, and partners can play a big part in helping you recover from an anxiety disorder. This  information is not intended to replace advice given to you by your health care provider. Make sure you discuss any questions you have with your health care provider. Document Revised: 02/02/2019 Document Reviewed: 02/02/2019 Elsevier Patient Education  2020 [employer].

## 2019-12-22 ENCOUNTER — Other Ambulatory Visit: Payer: Self-pay

## 2019-12-22 ENCOUNTER — Ambulatory Visit (INDEPENDENT_AMBULATORY_CARE_PROVIDER_SITE_OTHER): Payer: Managed Care, Other (non HMO) | Admitting: Family Medicine

## 2019-12-22 ENCOUNTER — Encounter: Payer: Self-pay | Admitting: Family Medicine

## 2019-12-22 VITALS — BP 98/80 | HR 106 | Temp 98.0°F | Wt 92.0 lb

## 2019-12-22 DIAGNOSIS — J3089 Other allergic rhinitis: Secondary | ICD-10-CM | POA: Diagnosis not present

## 2019-12-22 DIAGNOSIS — F900 Attention-deficit hyperactivity disorder, predominantly inattentive type: Secondary | ICD-10-CM | POA: Diagnosis not present

## 2019-12-22 DIAGNOSIS — R636 Underweight: Secondary | ICD-10-CM | POA: Insufficient documentation

## 2019-12-22 DIAGNOSIS — J4520 Mild intermittent asthma, uncomplicated: Secondary | ICD-10-CM | POA: Diagnosis not present

## 2019-12-22 MED ORDER — CETIRIZINE HCL 10 MG PO TABS: 10.0000 mg | ORAL_TABLET | Freq: Every day | ORAL | 3 refills | Status: AC

## 2019-12-22 NOTE — Progress Notes (Signed)
Subjective:    Patient ID: Kim Stark, female    DOB: Sep 24, 2000, 19 y.o.   MRN: [id number]  No chief complaint on file.   HPI Patient was seen today for f/u.  Pt states she has not started the Adderall.  Initially forgot to pick up the rx, then picked it up, but never started it.  Pt notes continued concentration issues, but states they have improved some at work.  Pt notes increase in allergy symptoms as she recently got a cat.  Taking Zyrtec daily.  Denies increase in asthma symptoms.  Pt eating regular meals, gained 2 lbs.  Pt plans on giving her cat away and moving in with her grandmother who has a h/o breast cancer with met to the brain.  Past Medical History:  Diagnosis Date  . ADHD   . Asthma   . Depression     No Known Allergies  ROS General: Denies fever, chills, night sweats, changes in appetite  +changes in weight HEENT: Denies headaches, ear pain, changes in vision, rhinorrhea, sore throat  +itchy eyes, sneezing CV: Denies CP, palpitations, SOB, orthopnea Pulm: Denies SOB, cough, wheezing GI: Denies abdominal pain, nausea, vomiting, diarrhea, constipation GU: Denies dysuria, hematuria, frequency, vaginal discharge Msk: Denies muscle cramps, joint pains Neuro: Denies weakness, numbness, tingling Skin: Denies rashes, bruising Psych: Denies depression, anxiety, hallucinations  +decreased concentration    Objective:    Blood pressure 98/80, pulse (!) 106, temperature 98 F (36.7 C), temperature source Temporal, weight 92 lb (41.7 kg), SpO2 98 %.  Gen. Pleasant, well-nourished, in no distress, normal affect  HEENT: [place]/AT, face symmetric, no scleral icterus, PERRLA, EOMI, nares patent without drainage Lungs: no accessory muscle use, CTAB, no wheezes or rales Cardiovascular: RRR, no m/r/g, no peripheral edema Neuro:  A&Ox3, CN II-XII intact, normal gait Skin:  Warm, no lesions/ rash   Wt Readings from Last 3 Encounters:  11/18/19 90 lb 11.2 oz (41.1 kg)   10/14/19 90 lb (40.8 kg)  08/18/17 119 lb 1.6 oz (54 kg) (38 %, Z= -0.32)*   * Growth percentiles are based on CDC (Girls, 2-20 Years) data.    Lab Results  Component Value Date   WBC 2.7 (L) 10/14/2019   HGB 12.3 10/14/2019   HCT 37.1 10/14/2019   PLT 195.0 10/14/2019   GLUCOSE 82 10/14/2019   CHOL 123 10/14/2019   TRIG 38.0 10/14/2019   HDL 67.80 10/14/2019   LDLCALC 47 10/14/2019   ALT 10 10/14/2019   AST 13 10/14/2019   NA 140 10/14/2019   K 3.7 10/14/2019   CL 106 10/14/2019   CREATININE 0.69 10/14/2019   BUN 14 10/14/2019   CO2 28 10/14/2019   TSH 4.82 10/14/2019    Assessment/Plan:  Attention deficit hyperactivity disorder (ADHD), predominantly inattentive type -consider starting adderall -discussed monitoring weight and sleep if starting med.  Regular meals and snaks encouraged -f/u in 1 month if starts med  Environmental and seasonal allergies  -likely increased 2/2 cat -ok to continue Zyrtec. -can add flonase if needed - Plan: cetirizine (ZYRTEC) 10 MG tablet  Mild intermittent asthma without complication -stable -continue to monitor  Underweight -gained 2 lbs since last OFV -continued wt gain advised -encouraged to eat several meals and snacks throughout the day.  Consider supplemental drinks  F/u in 1 month  Grier Mitts, MD

## 2019-12-22 NOTE — Patient Instructions (Addendum)
[facility] has your allergy medicine Cetirizine 10 mg, 365 tabs for around $10.  You can find it online.  You can follow up in one month if you start the Adderall.  Allergies, Adult An allergy is when your body's defense system (immune system) overreacts to an otherwise harmless substance (allergen) that you breathe in or eat or something that touches your skin. When you come into contact with something that you are allergic to, your immune system produces certain proteins (antibodies). These proteins cause cells to release chemicals (histamines) that trigger the symptoms of an allergic reaction. Allergies often affect the nasal passages (allergic rhinitis), eyes (allergic conjunctivitis), skin (atopic dermatitis), and stomach. Allergies can be mild or severe. Allergies cannot spread from person to person (are not contagious). They can develop at any age and may be outgrown. What increases the risk? You may be at greater risk of allergies if other people in your family have allergies. What are the signs or symptoms? Symptoms depend on what type of allergy you have. They may include:  Runny, stuffy nose.  Sneezing.  Itchy mouth, ears, or throat.  Postnasal drip.  Sore throat.  Itchy, red, watery, or puffy eyes.  Skin rash or hives.  Stomach pain.  Vomiting.  Diarrhea.  Bloating.  Wheezing or coughing. People with a severe allergy to food, medicine, or an insect bite may have a life-threatening allergic reaction (anaphylaxis). Symptoms of anaphylaxis include:  Hives.  Itching.  Flushed face.  Swollen lips, tongue, or mouth.  Tight or swollen throat.  Chest pain or tightness in the chest.  Trouble breathing or shortness of breath.  Rapid heartbeat.  Dizziness or fainting.  Vomiting.  Diarrhea.  Pain in the abdomen. How is this diagnosed? This condition is diagnosed based on:  Your symptoms.  Your family and medical history.  A physical exam. You may  need to see a health care provider who specializes in treating allergies (allergist). You may also have tests, including:  Skin tests to see which allergens are causing your symptoms, such as: ? Skin prick test. In this test, your skin is pricked with a tiny needle and exposed to small amounts of possible allergens to see if your skin reacts. ? Intradermal skin test. In this test, a small amount of allergen is injected under your skin to see if your skin reacts. ? Patch test. In this test, a small amount of allergen is placed on your skin and then your skin is covered with a bandage. Your health care provider will check your skin after a couple of days to see if a rash has developed.  Blood tests.  Challenges tests. In this test, you inhale a small amount of allergen by mouth to see if you have an allergic reaction. You may also be asked to:  Keep a food diary. A food diary is a record of all the foods and drinks you have in a day and any symptoms you experience.  Practice an elimination diet. An elimination diet involves eliminating specific foods from your diet and then adding them back in one by one to find out if a certain food causes an allergic reaction. How is this treated? Treatment for allergies depends on your symptoms. Treatment may include:  Cold compresses to soothe itching and swelling.  Eye drops.  Nasal sprays.  Using a saline spray or container (neti pot) to flush out the nose (nasal irrigation). These methods can help clear away mucus and keep the nasal passages  moist.  Using a humidifier.  Oral antihistamines or other medicines to block allergic reaction and inflammation.  Skin creams to treat rashes or itching.  Diet changes to eliminate food allergy triggers.  Repeated exposure to tiny amounts of allergens to build up a tolerance and prevent future allergic reactions (immunotherapy). These include: ? Allergy shots. ? Oral treatment. This involves taking small  doses of an allergen under the tongue (sublingual immunotherapy).  Emergency epinephrine injection (auto-injector) in case of an allergic emergency. This is a self-injectable, pre-measured medicine that must be given within the first few minutes of a serious allergic reaction. Follow these instructions at home:         Avoid known allergens whenever possible.  If you suffer from airborne allergens, wash out your nose daily. You can do this with a saline spray or a neti pot to flush out your nose (nasal irrigation).  Take over-the-counter and prescription medicines only as told by your health care provider.  Keep all follow-up visits as told by your health care provider. This is important.  If you are at risk of a severe allergic reaction (anaphylaxis), keep your auto-injector with you at all times.  If you have ever had anaphylaxis, wear a medical alert bracelet or necklace that states you have a severe allergy. Contact a health care provider if:  Your symptoms do not improve with treatment. Get help right away if:  You have symptoms of anaphylaxis, such as: ? Swollen mouth, tongue, or throat. ? Pain or tightness in your chest. ? Trouble breathing or shortness of breath. ? Dizziness or fainting. ? Severe abdominal pain, vomiting, or diarrhea. This information is not intended to replace advice given to you by your health care provider. Make sure you discuss any questions you have with your health care provider. Document Revised: 11/26/2017 Document Reviewed: 03/20/2016 Elsevier Patient Education  [place] Prevention, Adult Although you may not be able to control the fact that you have asthma, you can take actions to prevent episodes of asthma (asthma attacks). These actions include:  Creating a written plan for managing and treating your asthma attacks (asthma action plan).  Monitoring your asthma.  Avoiding things that can irritate your airways or  make your asthma symptoms worse (asthma triggers).  Taking your medicines as directed.  Acting quickly if you have signs or symptoms of an asthma attack. What are some ways to prevent an asthma attack? Create a plan Work with your health care provider to create an asthma action plan. This plan should include:  A list of your asthma triggers and how to avoid them.  A list of symptoms that you experience during an asthma attack.  Information about when to take medicine and how much medicine to take.  Information to help you understand your peak flow measurements.  Contact information for your health care providers.  Daily actions that you can take to control asthma. Monitor your asthma To monitor your asthma:  Use your peak flow meter every morning and every evening for 2-3 weeks. Record the results in a journal. A drop in your peak flow numbers on one or more days may mean that you are starting to have an asthma attack, even if you are not having symptoms.  When you have asthma symptoms, write them down in a journal.  Avoid asthma triggers Work with your health care provider to find out what your asthma triggers are. This can be done by:  Being tested  for allergies.  Keeping a journal that notes when asthma attacks occur and what may have contributed to them.  Asking your health care provider whether other medical conditions make your asthma worse. Common asthma triggers include:  Dust.  Smoke. This includes campfire smoke and secondhand smoke from tobacco products.  Pet dander.  Trees, grasses or pollens.  Very cold, dry, or humid air.  Mold.  Foods that contain high amounts of sulfites.  Strong smells.  Engine exhaust and air pollution.  Aerosol sprays and fumes from household cleaners.  Household pests and their droppings, including dust mites and cockroaches.  Certain medicines, including NSAIDs. Once you have determined your asthma triggers, take steps  to avoid them. Depending on your triggers, you may be able to reduce the chance of an asthma attack by:  Keeping your home clean. Have someone dust and vacuum your home for you 1 or 2 times a week. If possible, have them use a high-efficiency particulate arrestance (HEPA) vacuum.  Washing your sheets weekly in hot water.  Using allergy-proof mattress covers and casings on your bed.  Keeping pets out of your home.  Taking care of mold and water problems in your home.  Avoiding areas where people smoke.  Avoiding using strong perfumes or odor sprays.  Avoid spending a lot of time outdoors when pollen counts are high and on very windy days.  Talking with your health care provider before stopping or starting any new medicines. Medicines Take over-the-counter and prescription medicines only as told by your health care provider. Many asthma attacks can be prevented by carefully following your medicine schedule. Taking your medicines correctly is especially important when you cannot avoid certain asthma triggers. Even if you are doing well, do not stop taking your medicine and do not take less medicine. Act quickly If an asthma attack happens, acting quickly can decrease how severe it is and how long it lasts. Take these actions:  Pay attention to your symptoms. If you are coughing, wheezing, or having difficulty breathing, do not wait to see if your symptoms go away on their own. Follow your asthma action plan.  If you have followed your asthma action plan and your symptoms are not improving, call your health care provider or seek immediate medical care at the nearest hospital. It is important to write down how often you need to use your fast-acting rescue inhaler. You can track how often you use an inhaler in your journal. If you are using your rescue inhaler more often, it may mean that your asthma is not under control. Adjusting your asthma treatment plan may help you to prevent future asthma  attacks and help you to gain better control of your condition. How can I prevent an asthma attack when I exercise? Exercise is a common asthma trigger. To prevent asthma attacks during exercise:  Follow advice from your health care provider about whether you should use your fast-acting inhaler before exercising. Many people with asthma experience exercise-induced bronchoconstriction (EIB). This condition often worsens during vigorous exercise in cold, humid, or dry environments. Usually, people with EIB can stay very active by using a fast-acting inhaler before exercising.  Avoid exercising outdoors in very cold or humid weather.  Avoid exercising outdoors when pollen counts are high.  Warm up and cool down when exercising.  Stop exercising right away if asthma symptoms start. Consider taking part in exercises that are less likely to cause asthma symptoms such as:  Indoor swimming.  Biking.  Walking.  Hiking.  Playing football. This information is not intended to replace advice given to you by your health care provider. Make sure you discuss any questions you have with your health care provider. Document Revised: 08/15/2017 Document Reviewed: 02/17/2016 Elsevier Patient Education  2020 [employer]  Living With Attention Deficit Hyperactivity Disorder If you have been diagnosed with attention deficit hyperactivity disorder (ADHD), you may be relieved that you now know why you have felt or behaved a certain way. Still, you may feel overwhelmed about the treatment ahead. You may also wonder how to get the support you need and how to deal with the condition day-to-day. With treatment and support, you can live with ADHD and manage your symptoms. How to manage lifestyle changes Managing stress Stress is your body's reaction to life changes and events, both good and bad. To cope with the stress of an ADHD diagnosis, it may help to:  Learn more about ADHD.  Exercise regularly. Even a  short daily walk can lower stress levels.  Participate in training or education programs (including social skills training classes) that teach you to deal with symptoms.  Medicines Your health care provider may suggest certain medicines if he or she feels that they will help to improve your condition. Stimulant medicines are usually prescribed to treat ADHD, and therapy may also be prescribed. It is important to:  Avoid using alcohol and other substances that may prevent your medicines from working properly [facility](mayinteract).  Talk with your pharmacist or health care provider about all the medicines that you take, their possible side effects, and what medicines are safe to take together.  Make it your goal to take part in all treatment decisions (shared decision-making). Ask about possible side effects of medicines that your health care provider recommends, and tell him or her how you feel about having those side effects. It is best if shared decision-making with your health care provider is part of your total treatment plan. Relationships To strengthen your relationships with family members while treating your condition, consider taking part in family therapy. You might also attend self-help groups alone or with a loved one. Be honest about how your symptoms affect your relationships. Make an effort to communicate respectfully instead of fighting, and find ways to show others that you care. Psychotherapy may be useful in helping you cope with how ADHD affects your relationships. How to recognize changes in your condition The following signs may mean that your treatment is working well and your condition is improving:  Consistently being on time for appointments.  Being more organized at home and work.  Other people noticing improvements in your behavior.  Achieving goals that you set for yourself.  Thinking more clearly. The following signs may mean that your treatment is not working very  well:  Feeling impatience or more confusion.  Missing, forgetting, or being late for appointments.  An increasing sense of disorganization and messiness.  More difficulty in reaching goals that you set for yourself.  Loved ones becoming angry or frustrated with you. Where to find support Talking to others  Keep emotion out of important discussions and speak in a calm, logical way.  Listen closely and patiently to your loved ones. Try to understand their point of view, and try to avoid getting defensive.  Take responsibility for the consequences of your actions.  Ask that others do not take your behaviors personally.  Aim to solve problems as they come up, and express your feelings instead of bottling them  up.  Talk openly about what you need from your loved ones and how they can support you.  Consider going to family therapy sessions or having your family meet with a specialist who deals with ADHD-related behavior problems. Finances Not all insurance plans cover mental health care, so it is important to check with your insurance carrier. If paying for co-pays or counseling services is a problem, search for a local or county mental health care center. Public mental health care services may be offered there at a low cost or no cost when you are not able to see a private health care provider. If you are taking medicine for ADHD, you may be able to get the generic form, which may be less expensive than brand-name medicine. Some makers of prescription medicines also offer help to patients who cannot afford the medicines that they need. Follow these instructions at home:  Take over-the-counter and prescription medicines only as told by your health care provider. Check with your health care provider before taking any new medicines.  Create structure and an organized atmosphere at home. For example: ? Make a list of tasks, then rank them from most important to least important. Work on one  task at a time until your listed tasks are done. ? Make a daily schedule and follow it consistently every day. ? Use an appointment calendar, and check it 2 or 3 times a day to keep on track. Keep it with you when you leave the house. ? Create spaces where you keep certain things, and always put things back in their places after you use them.  Keep all follow-up visits as told by your health care provider. This is important. Questions to ask your health care provider:  What are the risks and benefits of taking medicines?  Would I benefit from therapy?  How often should I follow up with a health care provider? Contact a health care provider if:  You have side effects from your medicines, such as: ? Repeated muscle twitches, coughing, or speech outbursts. ? Sleep problems. ? Loss of appetite. ? Depression. ? New or worsening behavior problems. ? Dizziness. ? Unusually fast heartbeat. ? Stomach pains. ? Headaches. Get help right away if:  You have a severe reaction to a medicine.  Your behavior suddenly gets worse. Summary  With treatment and support, you can live with ADHD and manage your symptoms.  The medicines that are most often prescribed for ADHD are stimulants.  Consider taking part in family therapy or self-help groups with family members or friends.  When you talk with friends and family about your ADHD, be patient and communicate openly.  Take over-the-counter and prescription medicines only as told by your health care provider. Check with your health care provider before taking any new medicines. This information is not intended to replace advice given to you by your health care provider. Make sure you discuss any questions you have with your health care provider. Document Revised: 12/25/2018 Document Reviewed: 01/02/2017 Elsevier Patient Education  2020 [employer].
